# Patient Record
Sex: Male | Born: 1985 | Race: Black or African American | Hispanic: No | Marital: Single | Smoking: Former smoker
Health system: Southern US, Community
[De-identification: ages and names within clinical notes are randomized; demographics above are authoritative.]

## PROBLEM LIST (undated history)

## (undated) DIAGNOSIS — F819 Developmental disorder of scholastic skills, unspecified: Secondary | ICD-10-CM

## (undated) DIAGNOSIS — I10 Essential (primary) hypertension: Secondary | ICD-10-CM

## (undated) DIAGNOSIS — G43909 Migraine, unspecified, not intractable, without status migrainosus: Secondary | ICD-10-CM

## (undated) HISTORY — PX: TONSILLECTOMY AND ADENOIDECTOMY: SUR1326

## (undated) HISTORY — DX: Developmental disorder of scholastic skills, unspecified: F81.9

---

## 2010-07-23 ENCOUNTER — Emergency Department (HOSPITAL_COMMUNITY)
Admission: EM | Admit: 2010-07-23 | Discharge: 2010-07-23 | Payer: Self-pay | Source: Home / Self Care | Admitting: Emergency Medicine

## 2012-06-18 ENCOUNTER — Ambulatory Visit (INDEPENDENT_AMBULATORY_CARE_PROVIDER_SITE_OTHER): Payer: Medicare Other | Admitting: Family Medicine

## 2012-06-18 VITALS — BP 120/76 | HR 80 | Temp 98.4°F | Resp 16 | Ht 66.0 in | Wt 231.0 lb

## 2012-06-18 DIAGNOSIS — Z283 Underimmunization status: Secondary | ICD-10-CM

## 2012-06-18 DIAGNOSIS — Z0000 Encounter for general adult medical examination without abnormal findings: Secondary | ICD-10-CM

## 2012-06-18 DIAGNOSIS — Z2839 Other underimmunization status: Secondary | ICD-10-CM

## 2012-06-18 NOTE — Progress Notes (Signed)
[facility]:  Office Visit  Chief Complaint:  Chief Complaint  Patient presents with  . Annual Exam    also has paperwork for employment pe    HPI: Nathaniel Barton is a 26 y.o. male who complains of: 1. Need a complete physical but patient has Medicare and labs will not be covered and patient does not want to pay out of pocket for this. He denies any health issues. He denies any STDs. Patient has learning disability.  2. Needs immunization records filled out for job. He is an Public house manager. His records are all over the place. He has had different immunizations from different jobs and facilities.  Past Medical History  Diagnosis Date  . Learning disability    Past Surgical History  Procedure Date  . Tonsillectomy and adenoidectomy    History   Social History  . Marital Status: Single    Spouse Name: N/A    Number of Children: N/A  . Years of Education: N/A   Social History Main Topics  . Smoking status: Never Smoker   . Smokeless tobacco: None  . Alcohol Use: Yes  . Drug Use: No  . Sexually Active: Yes     Bisexual   Other Topics Concern  . None   Social History Narrative  . None   Family History  Problem Relation Age of Onset  . Hypertension Mother    Allergies  Allergen Reactions  . Penicillins    Prior to Admission medications   Medication Sig Start Date End Date Taking? Authorizing Provider  ibuprofen (ADVIL,MOTRIN) 800 MG tablet Take 800 mg by mouth every 8 (eight) hours as needed.   Yes Historical Provider, MD     ROS: The patient denies fevers, chills, night sweats, unintentional weight loss, chest pain, palpitations, wheezing, dyspnea on exertion, nausea, vomiting, abdominal pain, dysuria, hematuria, melena, numbness, weakness, or tingling.  All other systems have been reviewed and were otherwise negative with the exception of those mentioned in the HPI and as above.    PHYSICAL EXAM: Filed Vitals:   06/18/12 1140  BP: 120/76  Pulse:  80  Temp: 98.4 F (36.9 C)  Resp: 16   Filed Vitals:   06/18/12 1140  Height: 5\' 6"  (1.676 m)  Weight: 231 lb (104.781 kg)   Body mass index is 37.28 kg/(m^2).  General: Alert, no acute distress HEENT:  Normocephalic, atraumatic, oropharynx patent. EOMI. PERRLA, fundoscopic exam nl Cardiovascular:  Regular rate and rhythm, no rubs murmurs or gallops.  No Carotid bruits, radial pulse intact. No pedal edema.  Respiratory: Clear to auscultation bilaterally.  No wheezes, rales, or rhonchi.  No cyanosis, no use of accessory musculature GI: No organomegaly, abdomen is soft and non-tender, positive bowel sounds.  No masses. Skin: No rashes. Neurologic: Facial musculature symmetric. Psychiatric: Patient is appropriate throughout our interaction. Lymphatic: No cervical lymphadenopathy Musculoskeletal: Gait intact. GU-normal, no hernias, no rashes, no DC   LABS: No results found for this or any previous visit.   EKG/XRAY:   Primary read interpreted by Dr. Conley Roll[facility] at [facility].   ASSESSMENT/PLAN: Encounter Diagnoses  Name Primary?  . Annual physical exam Yes  . Immunization deficiency    I will titers for Varicella and also Hep B since he states he must have gotten them I am awaiting records for DTap, patient had them done recently We have made copies of the forms I have attempted to fill out but have not signed until titers return He has the original  and there is a copy of the forms in my bo which I will fill out when his labs return I will sign off once get labs, if he needs anything then we will let him know and he can return for it here or to the [facility].     Chidera Thivierge PHUONG, DO 06/18/2012 1:10 PM

## 2012-06-19 LAB — VARICELLA ZOSTER ANTIBODY, IGG: Varicella IgG: 3.51 {ISR} — ABNORMAL HIGH (ref <0.90)

## 2012-06-19 LAB — HEPATITIS B SURFACE ANTIGEN: Hepatitis B Surface Ag: NEGATIVE

## 2012-06-19 LAB — HEPATITIS B SURFACE ANTIBODY, QUANTITATIVE: Hep B S AB Quant (Post): 31.7 m[IU]/mL

## 2012-07-08 ENCOUNTER — Encounter: Payer: Self-pay | Admitting: Family Medicine

## 2012-09-11 ENCOUNTER — Emergency Department (HOSPITAL_COMMUNITY)
Admission: EM | Admit: 2012-09-11 | Discharge: 2012-09-11 | Disposition: A | Discharge status: Home / Self Care | Payer: Medicare Other | Attending: Emergency Medicine | Admitting: Emergency Medicine

## 2012-09-11 ENCOUNTER — Emergency Department (HOSPITAL_COMMUNITY): Payer: Medicare Other

## 2012-09-11 ENCOUNTER — Encounter (HOSPITAL_COMMUNITY): Payer: Self-pay | Admitting: Emergency Medicine

## 2012-09-11 DIAGNOSIS — IMO0001 Reserved for inherently not codable concepts without codable children: Secondary | ICD-10-CM | POA: Insufficient documentation

## 2012-09-11 DIAGNOSIS — R197 Diarrhea, unspecified: Secondary | ICD-10-CM | POA: Insufficient documentation

## 2012-09-11 DIAGNOSIS — R0602 Shortness of breath: Secondary | ICD-10-CM | POA: Insufficient documentation

## 2012-09-11 DIAGNOSIS — J111 Influenza due to unidentified influenza virus with other respiratory manifestations: Secondary | ICD-10-CM

## 2012-09-11 DIAGNOSIS — F8189 Other developmental disorders of scholastic skills: Secondary | ICD-10-CM | POA: Insufficient documentation

## 2012-09-11 MED ORDER — ALBUTEROL SULFATE HFA 108 (90 BASE) MCG/ACT IN AERS
2.0000 | INHALATION_SPRAY | RESPIRATORY_TRACT | Status: DC | PRN
  Administered 2012-09-11: 2 via RESPIRATORY_TRACT
  Filled 2012-09-11: qty 6.7

## 2012-09-11 MED ORDER — ACETAMINOPHEN-CODEINE 120-12 MG/5ML PO SOLN
10.0000 mL | Freq: Once | ORAL | Status: AC
  Administered 2012-09-11: 10 mL via ORAL
  Filled 2012-09-11: qty 10

## 2012-09-11 MED ORDER — IPRATROPIUM BROMIDE 0.02 % IN SOLN
0.5000 mg | Freq: Once | RESPIRATORY_TRACT | Status: AC
  Administered 2012-09-11: 0.5 mg via RESPIRATORY_TRACT
  Filled 2012-09-11: qty 2.5

## 2012-09-11 MED ORDER — PROMETHAZINE-DM 6.25-15 MG/5ML PO SYRP: 5.0000 mL | ORAL_SOLUTION | Freq: Four times a day (QID) | ORAL | Status: DC | PRN

## 2012-09-11 MED ORDER — PREDNISONE 50 MG PO TABS: 50.0000 mg | ORAL_TABLET | Freq: Every day | ORAL | Status: DC

## 2012-09-11 MED ORDER — ALBUTEROL SULFATE (5 MG/ML) 0.5% IN NEBU
5.0000 mg | INHALATION_SOLUTION | Freq: Once | RESPIRATORY_TRACT | Status: AC
  Administered 2012-09-11: 5 mg via RESPIRATORY_TRACT
  Filled 2012-09-11: qty 1

## 2012-09-11 MED ORDER — GUAIFENESIN ER 1200 MG PO TB12: 1.0000 | ORAL_TABLET | Freq: Two times a day (BID) | ORAL | Status: DC

## 2012-09-11 NOTE — ED Notes (Signed)
Started with bodyaches, cough, chills, sore throat last pm. Has been sitting with his sick mother in the hospital.

## 2012-09-11 NOTE — ED Provider Notes (Signed)
History     CSN: [id number]  Arrival date & time 09/11/12  1008   First MD Initiated Contact with Patient 09/11/12 1022      No chief complaint on file.   (Consider location/radiation/quality/duration/timing/severity/associated sxs/prior treatment) HPI Patient presents to the emergency department complaining of myalgias, cough, and diarrhea since yesterday. His body aches have subsided. He has a productive cough and nasal congestion, but no shortness of breath or chest pain. Abdominal pain and diarrhea yesterday, now only diarrhea but he has seen some blood in his stool.  He also reports a fever, measured at 102 degrees this morning at his house, and accompanying chills. No nausea, vomiting, chest pain, shortness of breath, headache, or sore throat. No history of asthma and patient is a nonsmoker. He has tried Nyquil cold and flu with minimal relief.  Also, patient complains of rectal tenderness, and he thinks he has a hemorrhoid. He has never had a hemorrhoid before. He has practiced receptive anal intercourse in the past, but not recently. The pain began yesterday.   Finally, he has had a lesion on his right scrotum for about a month. Occasionally it's tender, but never has any discharge or ulceration. He has one male sexual partner who is asymptomatic. No dysuria, pyuria, penile discharge, testicular swelling or tenderness.   Past Medical History  Diagnosis Date  . Learning disability     Past Surgical History  Procedure Date  . Tonsillectomy and adenoidectomy     Family History  Problem Relation Age of Onset  . Hypertension Mother     History  Substance Use Topics  . Smoking status: Never Smoker   . Smokeless tobacco: Not on file  . Alcohol Use: Yes      Review of Systems All other systems negative except as documented in the HPI. All pertinent positives and negatives as reviewed in the HPI.  Allergies  Penicillins  Home Medications   Current Outpatient Rx    Name  Route  Sig  Dispense  Refill  . NYQUIL COLD & FLU PO   Oral   Take 2 tablets by mouth once.           Pulse 110  Temp 99.1 F (37.3 C) (Oral)  Resp 24  SpO2 93%  Physical Exam  Constitutional: He is oriented to person, place, and time. He appears well-developed and well-nourished.  HENT:  Head: Normocephalic and atraumatic.  Mouth/Throat: Oropharynx is clear and moist. No oropharyngeal exudate.  Eyes: Conjunctivae normal are normal. Pupils are equal, round, and reactive to light.  Neck: Neck supple.  Cardiovascular: Normal rate and regular rhythm.   Pulmonary/Chest: Effort normal. No respiratory distress. He has wheezes in the right middle field, the right lower field, the left middle field and the left lower field.  Abdominal: Soft. Normal appearance. He exhibits no distension. There is no hepatosplenomegaly. There is no tenderness.  Genitourinary: Rectal exam shows tenderness. Rectal exam shows no external hemorrhoid and no internal hemorrhoid.        Lymphadenopathy:    He has no cervical adenopathy.  Neurological: He is alert and oriented to person, place, and time.  Skin: Skin is warm and dry.    ED Course  Procedures (including critical care time)  Labs Reviewed - No data to display Dg Chest 2 View  09/11/2012  *RADIOLOGY REPORT*  Clinical Data: Cough, congestion, shortness of breath  CHEST - 2 VIEW  Comparison: None.  Findings: Lungs are clear. No pleural effusion or  pneumothorax.  Cardiomediastinal silhouette is within normal limits.  Visualized osseous structures are within normal limits.  IMPRESSION: No evidence of acute cardiopulmonary disease.   Original Report Authenticated By: Charline Bills, M.D.      Patient be treated for viral URI is advised to rest as much possible.  Increase his fluid intake, follow up with his primary care Dr. for recheck.  Return here for any worsening in his condition   MDM          Carlyle Dolly,  PA-C 09/11/12 1311

## 2012-09-11 NOTE — ED Notes (Signed)
Patient transported to X-ray 

## 2012-09-13 NOTE — ED Provider Notes (Signed)
Medical screening examination/treatment/procedure(s) were performed by non-physician practitioner and as supervising physician I was immediately available for consultation/collaboration.    Nichael Ehly R Obbie Lewallen, MD 09/13/12 1708 

## 2012-11-21 ENCOUNTER — Emergency Department (HOSPITAL_COMMUNITY)
Admission: EM | Admit: 2012-11-21 | Discharge: 2012-11-21 | Disposition: A | Discharge status: Home / Self Care | Payer: Medicare Other | Attending: Emergency Medicine | Admitting: Emergency Medicine

## 2012-11-21 ENCOUNTER — Encounter (HOSPITAL_COMMUNITY): Payer: Self-pay | Admitting: *Deleted

## 2012-11-21 DIAGNOSIS — R110 Nausea: Secondary | ICD-10-CM | POA: Insufficient documentation

## 2012-11-21 DIAGNOSIS — H53149 Visual discomfort, unspecified: Secondary | ICD-10-CM | POA: Insufficient documentation

## 2012-11-21 DIAGNOSIS — H93239 Hyperacusis, unspecified ear: Secondary | ICD-10-CM | POA: Insufficient documentation

## 2012-11-21 DIAGNOSIS — G43909 Migraine, unspecified, not intractable, without status migrainosus: Secondary | ICD-10-CM | POA: Insufficient documentation

## 2012-11-21 DIAGNOSIS — Z8659 Personal history of other mental and behavioral disorders: Secondary | ICD-10-CM | POA: Insufficient documentation

## 2012-11-21 DIAGNOSIS — I10 Essential (primary) hypertension: Secondary | ICD-10-CM | POA: Insufficient documentation

## 2012-11-21 HISTORY — DX: Essential (primary) hypertension: I10

## 2012-11-21 HISTORY — DX: Migraine, unspecified, not intractable, without status migrainosus: G43.909

## 2012-11-21 MED ORDER — METOCLOPRAMIDE HCL 5 MG/ML IJ SOLN
10.0000 mg | Freq: Once | INTRAMUSCULAR | Status: AC
  Administered 2012-11-21: 10 mg via INTRAVENOUS
  Filled 2012-11-21: qty 2

## 2012-11-21 MED ORDER — ACETAMINOPHEN 500 MG PO TABS
1000.0000 mg | ORAL_TABLET | Freq: Once | ORAL | Status: AC
  Administered 2012-11-21: 1000 mg via ORAL
  Filled 2012-11-21: qty 2

## 2012-11-21 NOTE — ED Provider Notes (Signed)
History     CSN: [id number]  Arrival date & time 11/21/12  1743   First MD Initiated Contact with Patient 11/21/12 1853      Chief Complaint  Patient presents with  . Migraine    (Consider location/radiation/quality/duration/timing/severity/associated sxs/prior treatment) HPI Complains of right sided temporal headache gradual onset 2 weeks ago. Throbbing in nature. Treated with ibuprofen, without relief. Associated symptoms include nausea, hyperacusis and photophobia. Pain similar to migraines he's had for the past 2 years. Pain is severe and nonradiating. Made worse by noises and bright lights. Not improved by anything Past Medical History  Diagnosis Date  . Learning disability   . Migraines   . Hypertension     Past Surgical History  Procedure Laterality Date  . Tonsillectomy and adenoidectomy      Family History  Problem Relation Age of Onset  . Hypertension Mother     History  Substance Use Topics  . Smoking status: Never Smoker   . Smokeless tobacco: Not on file  . Alcohol Use: Yes     Comment: occasionally      Review of Systems  Constitutional: Negative.   HENT:       Photophobia  Eyes: Positive for photophobia.  Respiratory: Negative.   Cardiovascular: Negative.   Gastrointestinal: Positive for nausea.  Musculoskeletal: Negative.   Skin: Negative.   Neurological: Positive for headaches.  Psychiatric/Behavioral: Negative.   All other systems reviewed and are negative.    Allergies  Latex and Penicillins  Home Medications   Current Outpatient Rx  Name  Route  Sig  Dispense  Refill  . ibuprofen (ADVIL,MOTRIN) 200 MG tablet   Oral   Take 200 mg by mouth every 6 (six) hours as needed for pain. For headache pain           BP 139/92  Pulse 87  Temp(Src) 98.4 F (36.9 C) (Oral)  Resp 22  SpO2 95%  Physical Exam  Nursing note and vitals reviewed. Constitutional: He is oriented to person, place, and time. He appears well-developed and  well-nourished. He appears distressed.  Appears uncomfortable Glasgow Coma Score 15  HENT:  Head: Normocephalic and atraumatic.  Eyes: Conjunctivae and EOM are normal. Pupils are equal, round, and reactive to light.  Fundi not well visualized. No sob conjunctival erythema  Neck: Neck supple. No tracheal deviation present. No thyromegaly present.  Cardiovascular: Normal rate and regular rhythm.   No murmur heard. Pulmonary/Chest: Effort normal and breath sounds normal.  Abdominal: Soft. Bowel sounds are normal. He exhibits no distension. There is no tenderness.  Obese  Musculoskeletal: Normal range of motion. He exhibits no edema and no tenderness.  Neurological: He is alert and oriented to person, place, and time. He has normal reflexes. No cranial nerve deficit. Coordination normal.  Gait normal Romberg normal prior drift  Skin: Skin is warm and dry. No rash noted.  Psychiatric: He has a normal mood and affect.    ED Course  Procedures (including critical care time)  Labs Reviewed - No data to display No results found. 7:50 PM headache has resolved after treatment with Tylenol and Reglan IV. Patient asymptomatic alert Glasgow Coma Score 15  No diagnosis found.    MDM  Plan followup PMD as needed Diagnosis migraine headache        Doug Sou, MD 11/21/12 2001

## 2012-11-21 NOTE — ED Notes (Signed)
Pt has hx of migraines, but has not had one in 2 years.  For the past 2 weeks pt having increasingly painful headaches and today it has become unbearable.  Pain is to R temple. Pt is photophobic and nauseated.  Appears in great pain.

## 2012-11-21 NOTE — ED Notes (Signed)
Pharmacy tech at bedside 

## 2012-11-21 NOTE — ED Notes (Signed)
PT given ice pack 

## 2012-11-21 NOTE — ED Notes (Signed)
Pt reports he has had a HA X 2 weeks, took ibuprofen earlier today and got some relief but still have a HA, pt c/o photophobia. Pt in nad, skin warm and dry, resp e/u.

## 2013-01-03 ENCOUNTER — Emergency Department (HOSPITAL_COMMUNITY)
Admission: EM | Admit: 2013-01-03 | Discharge: 2013-01-03 | Disposition: A | Discharge status: Home / Self Care | Payer: Medicare Other | Attending: Emergency Medicine | Admitting: Emergency Medicine

## 2013-01-03 ENCOUNTER — Encounter (HOSPITAL_COMMUNITY): Payer: Self-pay | Admitting: Neurology

## 2013-01-03 ENCOUNTER — Emergency Department (HOSPITAL_COMMUNITY): Payer: Medicare Other

## 2013-01-03 DIAGNOSIS — S199XXA Unspecified injury of neck, initial encounter: Secondary | ICD-10-CM | POA: Diagnosis not present

## 2013-01-03 DIAGNOSIS — Y92410 Unspecified street and highway as the place of occurrence of the external cause: Secondary | ICD-10-CM | POA: Diagnosis not present

## 2013-01-03 DIAGNOSIS — Y9389 Activity, other specified: Secondary | ICD-10-CM | POA: Diagnosis not present

## 2013-01-03 DIAGNOSIS — Z8679 Personal history of other diseases of the circulatory system: Secondary | ICD-10-CM | POA: Insufficient documentation

## 2013-01-03 DIAGNOSIS — I10 Essential (primary) hypertension: Secondary | ICD-10-CM | POA: Diagnosis not present

## 2013-01-03 DIAGNOSIS — S0990XA Unspecified injury of head, initial encounter: Secondary | ICD-10-CM | POA: Diagnosis not present

## 2013-01-03 DIAGNOSIS — IMO0002 Reserved for concepts with insufficient information to code with codable children: Secondary | ICD-10-CM | POA: Diagnosis present

## 2013-01-03 DIAGNOSIS — S0993XA Unspecified injury of face, initial encounter: Secondary | ICD-10-CM | POA: Insufficient documentation

## 2013-01-03 DIAGNOSIS — Z8659 Personal history of other mental and behavioral disorders: Secondary | ICD-10-CM | POA: Diagnosis not present

## 2013-01-03 MED ORDER — OXYCODONE-ACETAMINOPHEN 5-325 MG PO TABS
1.0000 | ORAL_TABLET | Freq: Once | ORAL | Status: AC
  Administered 2013-01-03: 1 via ORAL
  Filled 2013-01-03: qty 1

## 2013-01-03 MED ORDER — IBUPROFEN 600 MG PO TABS: 600.0000 mg | ORAL_TABLET | Freq: Four times a day (QID) | ORAL | Status: DC | PRN

## 2013-01-03 MED ORDER — TRAMADOL HCL 50 MG PO TABS: 50.0000 mg | ORAL_TABLET | Freq: Four times a day (QID) | ORAL | Status: DC | PRN

## 2013-01-03 MED ORDER — METHOCARBAMOL 500 MG PO TABS: 1000.0000 mg | ORAL_TABLET | Freq: Four times a day (QID) | ORAL | Status: DC
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
## 2013-01-03 NOTE — ED Notes (Signed)
Pt returned to xray due to needing another image for cervical spine.

## 2013-01-03 NOTE — ED Notes (Signed)
Patient able to ambulate in hall independently. Patient does have a limp favoring the right leg. When asked, patient rates right leg pain an 8/10.

## 2013-01-04 NOTE — ED Provider Notes (Signed)
Medical screening examination/treatment/procedure(s) were performed by non-physician practitioner and as supervising physician I was immediately available for consultation/collaboration.  Juliet Rude. Rubin Payor, MD 01/04/13 [phone]

## 2013-01-10 ENCOUNTER — Other Ambulatory Visit: Payer: Self-pay | Admitting: Sports Medicine

## 2013-01-10 DIAGNOSIS — M545 Low back pain: Secondary | ICD-10-CM

## 2013-01-11 ENCOUNTER — Ambulatory Visit
Admission: RE | Admit: 2013-01-11 | Discharge: 2013-01-11 | Disposition: A | Discharge status: Home / Self Care | Payer: Medicare Other | Source: Ambulatory Visit | Attending: Sports Medicine | Admitting: Sports Medicine

## 2013-01-11 DIAGNOSIS — M545 Low back pain, unspecified: Secondary | ICD-10-CM

## 2013-05-14 ENCOUNTER — Telehealth: Payer: Self-pay

## 2013-05-14 NOTE — Telephone Encounter (Signed)
Contacted patient regarding new patient referral from [facility].   Appointment given. Referral noted counseling needed. Appointment also scheduled with [facility] Counselor but none available until October, 2014. Patient requested a reminder letter be mailed along with address.  Letter sent.   Laurell Josephs, RN

## 2013-05-28 ENCOUNTER — Ambulatory Visit (INDEPENDENT_AMBULATORY_CARE_PROVIDER_SITE_OTHER): Payer: Medicare Other

## 2013-05-28 ENCOUNTER — Other Ambulatory Visit (HOSPITAL_COMMUNITY)
Admission: RE | Admit: 2013-05-28 | Discharge: 2013-05-28 | Disposition: A | Discharge status: Home / Self Care | Payer: Medicare Other | Source: Ambulatory Visit | Attending: Internal Medicine | Admitting: Internal Medicine

## 2013-05-28 DIAGNOSIS — F819 Developmental disorder of scholastic skills, unspecified: Secondary | ICD-10-CM

## 2013-05-28 DIAGNOSIS — Z23 Encounter for immunization: Secondary | ICD-10-CM

## 2013-05-28 DIAGNOSIS — Z791 Long term (current) use of non-steroidal anti-inflammatories (NSAID): Secondary | ICD-10-CM

## 2013-05-28 DIAGNOSIS — B20 Human immunodeficiency virus [HIV] disease: Secondary | ICD-10-CM

## 2013-05-28 DIAGNOSIS — Z113 Encounter for screening for infections with a predominantly sexual mode of transmission: Secondary | ICD-10-CM | POA: Insufficient documentation

## 2013-05-28 LAB — COMPLETE METABOLIC PANEL WITH GFR
ALT: 21 U/L (ref 0–53)
CO2: 27 mEq/L (ref 19–32)
Calcium: 9.7 mg/dL (ref 8.4–10.5)
Chloride: 104 mEq/L (ref 96–112)
Creat: 1.04 mg/dL (ref 0.50–1.35)
GFR, Est African American: >89 mL/min
GFR, Est Non African American: >89 mL/min
Glucose, Bld: 86 mg/dL (ref 70–99)
Total Bilirubin: 0.3 mg/dL (ref 0.3–1.2)

## 2013-05-28 LAB — CBC WITH DIFFERENTIAL/PLATELET
Eosinophils Absolute: 0.1 10*3/uL (ref 0.0–0.7)
Eosinophils Relative: 2 % (ref 0–5)
HCT: 46.6 % (ref 39.0–52.0)
Hemoglobin: 15.6 g/dL (ref 13.0–17.0)
Lymphocytes Relative: 38 % (ref 12–46)
Lymphs Abs: 1.8 10*3/uL (ref 0.7–4.0)
MCH: 27.2 pg (ref 26.0–34.0)
MCV: 81.3 fL (ref 78.0–100.0)
Monocytes Absolute: 0.6 10*3/uL (ref 0.1–1.0)
Monocytes Relative: 12 % (ref 3–12)
RBC: 5.73 MIL/uL (ref 4.22–5.81)
WBC: 4.7 10*3/uL (ref 4.0–10.5)

## 2013-05-28 LAB — LIPID PANEL
HDL: 37 mg/dL — ABNORMAL LOW (ref >39)
Total CHOL/HDL Ratio: 4.5 Ratio
Triglycerides: 148 mg/dL (ref <150)

## 2013-05-28 LAB — HEPATITIS B SURFACE ANTIGEN: Hepatitis B Surface Ag: NEGATIVE

## 2013-05-28 LAB — HEPATITIS B SURFACE ANTIBODY,QUALITATIVE: Hep B S Ab: POSITIVE — AB
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
## 2013-05-29 LAB — URINALYSIS
Glucose, UA: NEGATIVE mg/dL
Ketones, ur: NEGATIVE mg/dL
Leukocytes, UA: NEGATIVE
Nitrite: NEGATIVE
Specific Gravity, Urine: 1.02 (ref 1.005–1.030)
pH: 6 (ref 5.0–8.0)

## 2013-05-29 LAB — T-HELPER CELL (CD4) - (RCID CLINIC ONLY): CD4 % Helper T Cell: 19 % — ABNORMAL LOW (ref 33–55)

## 2013-05-30 LAB — HEPATITIS A ANTIBODY, TOTAL: Hep A Total Ab: NEGATIVE

## 2013-05-31 LAB — HIV-1 GENOTYPR PLUS

## 2013-06-04 ENCOUNTER — Encounter: Payer: Self-pay | Admitting: Internal Medicine

## 2013-06-04 ENCOUNTER — Ambulatory Visit (INDEPENDENT_AMBULATORY_CARE_PROVIDER_SITE_OTHER): Payer: Medicare Other | Admitting: Internal Medicine

## 2013-06-04 VITALS — BP 142/95 | HR 94 | Temp 98.4°F | Wt 225.8 lb

## 2013-06-04 DIAGNOSIS — B20 Human immunodeficiency virus [HIV] disease: Secondary | ICD-10-CM

## 2013-06-04 DIAGNOSIS — A749 Chlamydial infection, unspecified: Secondary | ICD-10-CM

## 2013-06-04 MED ORDER — ELVITEG-COBIC-EMTRICIT-TENOFDF 150-150-200-300 MG PO TABS: 1.0000 | ORAL_TABLET | Freq: Every day | ORAL | Status: DC

## 2013-06-04 MED ORDER — AZITHROMYCIN 250 MG PO TABS
1000.0000 mg | ORAL_TABLET | Freq: Every day | ORAL | Status: DC
  Administered 2013-06-04: 1000 mg via ORAL

## 2013-06-04 NOTE — Progress Notes (Signed)
  [facility] - Pharmacist    HPI: Nathaniel Barton is a 27 y.o. male here to establish care at [facility].  Allergies: Allergies  Allergen Reactions  . Latex     itching  . Penicillins Itching    Vitals: Temp: 98.4 F (36.9 C) (10/07 1439) Temp src: Oral (10/07 1439) BP: 142/95 mmHg (10/07 1439) Pulse Rate: 94 (10/07 1439)  Past Medical History: Past Medical History  Diagnosis Date  . Learning disability   . Migraines   . Hypertension     Social History: History   Social History  . Marital Status: Single    Spouse Name: N/A    Number of Children: N/A  . Years of Education: N/A   Social History Main Topics  . Smoking status: Current Some Day Smoker -- 0.40 packs/day  . Smokeless tobacco: Never Used  . Alcohol Use: 1.8 oz/week    3 Glasses of wine per week     Comment: occasionally  . Drug Use: No  . Sexual Activity: Yes    Partners: Male    Birth Control/ Protection: Condom     Comment: Bisexual   Other Topics Concern  . None   Social History Narrative  . None    Labs: HIV 1 RNA Quant (copies/mL)  Date Value  05/28/2013 45406*     CD4 T Cell Abs (/uL)  Date Value  05/28/2013 390*     Hep B S Ab (no units)  Date Value  05/28/2013 POS*     Hepatitis B Surface Ag (no units)  Date Value  05/28/2013 NEGATIVE      HCV Ab (no units)  Date Value  05/28/2013 NEGATIVE     HIV Genotype Composite Data Genotype Dates: 05/28/13 Mutations in [place] drug susceptibility RT Mutations E138A  PI Mutations none   Interpretation of Genotype Data per [employer] HIV Database Nucleoside RTIs  lamivudine (3TC) Susceptible abacavir (ABC) Susceptible zidovudine (AZT) Susceptible stavudine (D4T) Susceptible didanosine (DDI) Susceptible emtricitabine (FTC) Susceptible tenofovir (TDF) Susceptible   Non-Nucleoside RTIs  efavirenz (EFV) Susceptible etravirine (ETR) Potential low-level resistance nevirapine  (NVP) Susceptible rilpivirine (RPV) Low-level resistance   CrCl: The CrCl is unknown because both a height and weight (above a minimum accepted value) are required for this calculation.  Lipids:    Component Value Date/Time   CHOL 167 05/28/2013 1028   TRIG 148 05/28/2013 1028   HDL 37* 05/28/2013 1028   CHOLHDL 4.5 05/28/2013 1028   VLDL 30 05/28/2013 1028   LDLCALC 100* 05/28/2013 1028    Assessment: Nathaniel Barton is eager to start therapy for his HIV.  He is anxious and does not want to wait for the financial counseling process.  I encouraged him that his CD4 count is adequate at this time and there is no emergency to start therapy. He is committed to therapy but has never taken a daily medication before.  Recommendations: Counseled patient on Stribild including administration instructions and potential adverse effects. Adherence counseling performed with an emphasis on importance of adherence. Tips provided for adherence - he turned down an offer of a pill box, but was interested in setting cell phone reminders.  Sallee Provencal, Pharm.D., BCPS, AAHIVP Clinical Infectious Disease Pharmacist [facility] 06/04/2013, 3:44 PM

## 2013-06-04 NOTE — Progress Notes (Signed)
[facility] HIV CLINIC NOTE  RFV: initial visit for HIV Subjective:    Patient ID: Nathaniel Barton, male    DOB: [birth year], 27 y.o.   MRN: [id number]  HPIantonio is a 27 yo m with hiv dx in aug 2014, cd 4 count 390/ vl 45,406. geo E318A, R to RVP& ETR. ART naive. Hep B immune. He was tested in the setting of obtaining life insurance when he found out he was HIV positive. He previously tested for hiv in [place] 17yrs ago when it was negative.   RF: MSM, last relationship 1 year ago. No IVDU.  Not currently working, previously worked as Lawyer  Current Outpatient Prescriptions on File Prior to Visit  Medication Sig Dispense Refill  . ibuprofen (ADVIL,MOTRIN) 200 MG tablet Take 200 mg by mouth every 6 (six) hours as needed for pain. For headache pain      . ibuprofen (ADVIL,MOTRIN) 600 MG tablet Take 1 tablet (600 mg total) by mouth every 6 (six) hours as needed for pain.  20 tablet  0  . methocarbamol (ROBAXIN) 500 MG tablet Take 2 tablets (1,000 mg total) by mouth 4 (four) times daily.  20 tablet  0  . SUMAtriptan (IMITREX) 25 MG tablet Take 25 mg by mouth every 2 (two) hours as needed for migraine. May repeat in 2 hours if headache persists or recurs.      . traMADol (ULTRAM) 50 MG tablet Take 1 tablet (50 mg total) by mouth every 6 (six) hours as needed for pain.  15 tablet  0   No current facility-administered medications on file prior to visit.   Active Ambulatory Problems    Diagnosis Date Noted  . No Active Ambulatory Problems   Resolved Ambulatory Problems    Diagnosis Date Noted  . No Resolved Ambulatory Problems   Past Medical History  Diagnosis Date  . Learning disability   . Migraines   . Hypertension   - s/p tonsillectomy History   Social History  . Marital Status: Single    Spouse Name: N/A    Number of Children: N/A  . Years of Education: N/A   Occupational History  . Not on file.   Social History Main Topics  . Smoking status: Current Some Day Smoker -- 0.40  packs/day  . Smokeless tobacco: Never Used  . Alcohol Use: 1.8 oz/week    3 Glasses of wine per week     Comment: occasionally  . Drug Use: No  . Sexual Activity: Yes    Partners: Male    Birth Control/ Protection: Condom     Comment: Bisexual   Other Topics Concern  . Not on file   Social History Narrative  . No narrative on file   family history includes Hypertension in his mother.   Review of Systems Review of Systems  Constitutional: Negative for fever, chills, diaphoresis, activity change, appetite change, fatigue and unexpected weight change.  HENT: Negative for congestion, sore throat, rhinorrhea, sneezing, trouble swallowing and sinus pressure.  Eyes: Negative for photophobia and visual disturbance.  Respiratory: Negative for cough, chest tightness, shortness of breath, wheezing and stridor.  Cardiovascular: Negative for chest pain, palpitations and leg swelling.  Gastrointestinal: Negative for nausea, vomiting, abdominal pain, diarrhea, constipation, blood in stool, abdominal distention and anal bleeding.  Genitourinary: Negative for dysuria, hematuria, flank pain and difficulty urinating.  Musculoskeletal: right deltoid pain due to influenza vaccination last week. Skin: Negative for color change, pallor, rash and wound.  Neurological: Negative for dizziness, tremors,  weakness and light-headedness.  Hematological: Negative for adenopathy. Does not bruise/bleed easily.  Psychiatric/Behavioral: Negative for behavioral problems, confusion, sleep disturbance, dysphoric mood, decreased concentration and agitation.       Objective:   Physical Exam BP 142/95  Pulse 94  Temp(Src) 98.4 F (36.9 C) (Oral)  Wt 225 lb 12.8 oz (102.422 kg)  BMI 36.46 kg/m2 Physical Exam  Constitutional: He is oriented to person, place, and time. He appears well-developed and well-nourished. No distress.  HENT:  Mouth/Throat: Oropharynx is clear and moist. No oropharyngeal exudate.   Cardiovascular: Normal rate, regular rhythm and normal heart sounds. Exam reveals no gallop and no friction rub.  No murmur heard.  Pulmonary/Chest: Effort normal and breath sounds normal. No respiratory distress. He has no wheezes.  Abdominal: Soft. Bowel sounds are normal. He exhibits no distension. There is no tenderness.  Lymphadenopathy:  He has no cervical adenopathy.  Neurological: He is alert and oriented to person, place, and time.  Skin: Skin is warm and dry. No rash noted. No erythema.  Psychiatric: He has a normal mood and affect. His behavior is normal.  Ext = right arm tenderness due to flu shot      Assessment & Plan:  hiv = will start stribild. Pharmacy to consult on adherence. Will apply for pateint assistance  Health maintenance = received flu and pneumonia already. Will get hep a at next visit  Arm pain = no signs of erythema or swelling. Possibly had echymosis associated with injection. Recommend to use prn ibuprofen or tylenol for pain.  rtc in 4-6 wks

## 2013-06-12 ENCOUNTER — Other Ambulatory Visit: Payer: Self-pay | Admitting: Licensed Clinical Social Worker

## 2013-06-12 DIAGNOSIS — B20 Human immunodeficiency virus [HIV] disease: Secondary | ICD-10-CM

## 2013-06-12 MED ORDER — ELVITEG-COBIC-EMTRICIT-TENOFDF 150-150-200-300 MG PO TABS: 1.0000 | ORAL_TABLET | Freq: Every day | ORAL | Status: DC

## 2013-06-19 ENCOUNTER — Ambulatory Visit: Payer: Medicare Other | Admitting: Internal Medicine

## 2013-06-25 ENCOUNTER — Ambulatory Visit: Payer: Medicare Other

## 2013-06-25 ENCOUNTER — Other Ambulatory Visit: Payer: Self-pay | Admitting: *Deleted

## 2013-06-25 DIAGNOSIS — B20 Human immunodeficiency virus [HIV] disease: Secondary | ICD-10-CM

## 2013-06-25 MED ORDER — ELVITEG-COBIC-EMTRICIT-TENOFDF 150-150-200-300 MG PO TABS: 1.0000 | ORAL_TABLET | Freq: Every day | ORAL | Status: DC

## 2013-07-04 ENCOUNTER — Ambulatory Visit: Payer: Medicare Other | Admitting: Internal Medicine

## 2013-07-04 ENCOUNTER — Telehealth: Payer: Self-pay | Admitting: *Deleted

## 2013-07-04 ENCOUNTER — Encounter: Payer: Self-pay | Admitting: *Deleted

## 2013-07-04 NOTE — Telephone Encounter (Signed)
Patient needs proof of influenza immunization faxed to his employer [phone]).  RN faxed a letter without clinic heading at patient's request. Pt also was unable to make today's appointment - he was in orientation for work.  Pt has been rescheduled for Tuesday 11/11 with Dr. Drue Second. Andree Coss, RN

## 2013-07-09 ENCOUNTER — Ambulatory Visit: Payer: Medicare Other | Admitting: Internal Medicine

## 2013-08-14 ENCOUNTER — Ambulatory Visit (INDEPENDENT_AMBULATORY_CARE_PROVIDER_SITE_OTHER): Payer: Medicare Other | Admitting: Internal Medicine

## 2013-08-14 ENCOUNTER — Encounter: Payer: Self-pay | Admitting: Internal Medicine

## 2013-08-14 VITALS — BP 134/86 | HR 65 | Temp 98.5°F | Ht 66.0 in | Wt 224.0 lb

## 2013-08-14 DIAGNOSIS — B20 Human immunodeficiency virus [HIV] disease: Secondary | ICD-10-CM

## 2013-08-14 LAB — COMPLETE METABOLIC PANEL WITH GFR
ALT: 42 U/L (ref 0–53)
AST: 26 U/L (ref 0–37)
Albumin: 4.5 g/dL (ref 3.5–5.2)
Alkaline Phosphatase: 92 U/L (ref 39–117)
BUN: 7 mg/dL (ref 6–23)
Calcium: 9.5 mg/dL (ref 8.4–10.5)
Chloride: 103 mEq/L (ref 96–112)
Creat: 1.08 mg/dL (ref 0.50–1.35)
Potassium: 4.3 mEq/L (ref 3.5–5.3)
Total Bilirubin: 0.5 mg/dL (ref 0.3–1.2)

## 2013-08-14 LAB — CBC WITH DIFFERENTIAL/PLATELET
Basophils Absolute: 0 10*3/uL (ref 0.0–0.1)
Basophils Relative: 1 % (ref 0–1)
Eosinophils Absolute: 0 10*3/uL (ref 0.0–0.7)
Eosinophils Relative: 1 % (ref 0–5)
HCT: 46.4 % (ref 39.0–52.0)
MCH: 27.1 pg (ref 26.0–34.0)
MCHC: 34.1 g/dL (ref 30.0–36.0)
MCV: 79.5 fL (ref 78.0–100.0)
Monocytes Absolute: 0.3 10*3/uL (ref 0.1–1.0)
Neutro Abs: 2.2 10*3/uL (ref 1.7–7.7)
Platelets: 214 10*3/uL (ref 150–400)
RDW: 15.9 % — ABNORMAL HIGH (ref 11.5–15.5)

## 2013-08-14 NOTE — Progress Notes (Signed)
   Subjective:    Patient ID: Nathaniel Barton, male    DOB: [birth year], 27 y.o.   MRN: [id number]  HPI He comes in here for work in visit. He was seen as an initial visit in September by Dr. Drue Second for new diagnosis of HIV and was going to start Stribild on the drug assistance program. His initial CD4 count was 360. He subsequently one week later started Stribild with a month's supply provided by a friend. He then got insurance and began getting the medication and has been uninterrupted since 1 October. He comes in due to some side effects including some nausea with vomiting on the initial week and occasional nausea now. He also has noted some increased appetite with subsequent weight gain. Additionally he has had some fatigue since starting the medication.   Review of Systems  Constitutional: Positive for appetite change and fatigue. Negative for fever, activity change and unexpected weight change.  Gastrointestinal: Positive for nausea. Negative for vomiting, abdominal pain and diarrhea.  Skin: Negative for rash.  Psychiatric/Behavioral: The patient is not nervous/anxious.        Objective:   Physical Exam  Constitutional: He is oriented to person, place, and time. He appears well-developed and well-nourished. No distress.  HENT:  Mouth/Throat: No oropharyngeal exudate.  Eyes: No scleral icterus.  Cardiovascular: Normal rate, regular rhythm and normal heart sounds.   No murmur heard. Pulmonary/Chest: Effort normal and breath sounds normal. No respiratory distress. He has no wheezes.  Lymphadenopathy:    He has no cervical adenopathy.  Neurological: He is oriented to person, place, and time.  Skin: No rash noted.  Psychiatric: He has a normal mood and affect. His behavior is normal.          Assessment & Plan:

## 2013-08-14 NOTE — Assessment & Plan Note (Signed)
He seems to be doing well with his medication with some mild side effects. Is not interested in any antinausea medication and with relatively mild symptoms will continue. We'll check his labs today. He'll come back to see his primary provider in 2 weeks.

## 2013-08-16 LAB — T-HELPER CELL (CD4) - (RCID CLINIC ONLY)
CD4 % Helper T Cell: 22 % — ABNORMAL LOW (ref 33–55)
CD4 T Cell Abs: 460 /uL (ref 400–2700)

## 2013-10-08 ENCOUNTER — Ambulatory Visit: Payer: Medicare Other

## 2013-10-08 ENCOUNTER — Ambulatory Visit: Payer: Medicare Other | Admitting: Internal Medicine

## 2013-10-21 ENCOUNTER — Ambulatory Visit: Payer: Medicare Other

## 2013-10-21 ENCOUNTER — Encounter: Payer: Self-pay | Admitting: *Deleted

## 2013-10-21 ENCOUNTER — Ambulatory Visit (INDEPENDENT_AMBULATORY_CARE_PROVIDER_SITE_OTHER): Payer: Medicare Other | Admitting: Internal Medicine

## 2013-10-21 VITALS — Wt 229.0 lb

## 2013-10-21 DIAGNOSIS — R635 Abnormal weight gain: Secondary | ICD-10-CM

## 2013-10-21 DIAGNOSIS — B20 Human immunodeficiency virus [HIV] disease: Secondary | ICD-10-CM

## 2013-10-21 DIAGNOSIS — Z23 Encounter for immunization: Secondary | ICD-10-CM

## 2013-10-21 DIAGNOSIS — G43909 Migraine, unspecified, not intractable, without status migrainosus: Secondary | ICD-10-CM

## 2013-10-21 LAB — COMPLETE METABOLIC PANEL WITH GFR
ALT: 29 U/L (ref 0–53)
AST: 20 U/L (ref 0–37)
Albumin: 4.4 g/dL (ref 3.5–5.2)
Alkaline Phosphatase: 92 U/L (ref 39–117)
BILIRUBIN TOTAL: 0.4 mg/dL (ref 0.2–1.2)
BUN: 8 mg/dL (ref 6–23)
CALCIUM: 9.2 mg/dL (ref 8.4–10.5)
CHLORIDE: 105 meq/L (ref 96–112)
CO2: 26 meq/L (ref 19–32)
CREATININE: 1.03 mg/dL (ref 0.50–1.35)
Glucose, Bld: 80 mg/dL (ref 70–99)
Potassium: 4 mEq/L (ref 3.5–5.3)
Sodium: 139 mEq/L (ref 135–145)
Total Protein: 7.5 g/dL (ref 6.0–8.3)

## 2013-10-21 LAB — CBC WITH DIFFERENTIAL/PLATELET
Basophils Absolute: 0 10*3/uL (ref 0.0–0.1)
Basophils Relative: 0 % (ref 0–1)
EOS ABS: 0.1 10*3/uL (ref 0.0–0.7)
EOS PCT: 2 % (ref 0–5)
HEMATOCRIT: 46.7 % (ref 39.0–52.0)
Hemoglobin: 15.5 g/dL (ref 13.0–17.0)
LYMPHS ABS: 1.7 10*3/uL (ref 0.7–4.0)
LYMPHS PCT: 38 % (ref 12–46)
MCH: 27.8 pg (ref 26.0–34.0)
MCHC: 33.2 g/dL (ref 30.0–36.0)
MCV: 83.7 fL (ref 78.0–100.0)
MONO ABS: 0.5 10*3/uL (ref 0.1–1.0)
Monocytes Relative: 11 % (ref 3–12)
Neutro Abs: 2.2 10*3/uL (ref 1.7–7.7)
Neutrophils Relative %: 49 % (ref 43–77)
Platelets: 192 10*3/uL (ref 150–400)
RBC: 5.58 MIL/uL (ref 4.22–5.81)
RDW: 15.3 % (ref 11.5–15.5)
WBC: 4.5 10*3/uL (ref 4.0–10.5)

## 2013-10-21 MED ORDER — IBUPROFEN 800 MG PO TABS: 800.0000 mg | ORAL_TABLET | Freq: Three times a day (TID) | ORAL | Status: DC | PRN

## 2013-10-21 NOTE — Progress Notes (Signed)
   Subjective:    Patient ID: Nathaniel Barton, male    DOB: October 30, 1985, 28 y.o.   MRN: [id number]  HPI 28yo M with HIV, Cd 4 count of 460/VL<20 on stribild < 3- 4 months. Tolerating ok with exception to weight gain. 4 Lb increase since starting medication. Initially had significant nausea with med but now resolved. 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 He thinks he may have contracted illness from work since he is feeling nausea thinks he is getting a GI virus. No fevers. Chills.  Soc hx: [place] to [place] in [place] who is also HIV  Current Outpatient Prescriptions on File Prior to Visit  Medication Sig Dispense Refill  . elvitegravir-cobicistat-emtricitabine-tenofovir (STRIBILD) 150-150-200-300 MG TABS tablet Take 1 tablet by mouth daily with breakfast.  30 tablet  6  . ibuprofen (ADVIL,MOTRIN) 200 MG tablet Take 200 mg by mouth every 6 (six) hours as needed for pain. For headache pain      . SUMAtriptan (IMITREX) 25 MG tablet Take 25 mg by mouth every 2 (two) hours as needed for migraine. May repeat in 2 hours if headache persists or recurs.       No current facility-administered medications on file prior to visit.   Active Ambulatory Problems    Diagnosis Date Noted  . Human immunodeficiency virus (HIV) disease 06/04/2013   Resolved Ambulatory Problems    Diagnosis Date Noted  . No Resolved Ambulatory Problems   Past Medical History  Diagnosis Date  . Learning disability   . Migraines   . Hypertension       Review of Systems Nausea but no vomiting, and intermittent unintentional weight gain and weight loss. Net is 4+ up from last visit. All other 12 point ros is negative    Objective:   Physical Exam Wt 229 lb (103.874 kg) Physical Exam  Constitutional: He is oriented to person, place, and time. He appears well-developed and well-nourished. No distress.  HENT:  Mouth/Throat: Oropharynx is clear and moist. No oropharyngeal exudate.  Cardiovascular: Normal  rate, regular rhythm and normal heart sounds. Exam reveals no gallop and no friction rub.  No murmur heard.  Pulmonary/Chest: Effort normal and breath sounds normal. No respiratory distress. He has no wheezes.  Abdominal: Soft. Bowel sounds are normal. He exhibits no distension. There is no tenderness.  Lymphadenopathy:  He has no cervical adenopathy.  Neurological: He is alert and oriented to person, place, and time.  Skin: Skin is warm and dry. No rash noted. No erythema.  Psychiatric: He has a normal mood and affect. His behavior is normal.      Assessment & Plan:  hiv = well controlled. Will discussed that he is doing well on current regimen and can consider switching if he feels that weight gain is intolerable. Will check labs today  Weight gain = recommended diet modification  Migraines = gave rx for ibuprofen 800mg  Q 8hr PRN  Health maintenace = will give hep A vaccine series since he is is MSM  rtc in 3 months

## 2013-10-22 LAB — T-HELPER CELL (CD4) - (RCID CLINIC ONLY)
CD4 T CELL HELPER: 23 % — AB (ref 33–55)
CD4 T Cell Abs: 360 /uL — ABNORMAL LOW (ref 400–2700)

## 2013-10-22 LAB — HIV-1 RNA QUANT-NO REFLEX-BLD: HIV-1 RNA Quant, Log: <1.3 {Log} (ref <1.30)

## 2013-10-27 LAB — HLA B*5701: HLA-B 5701 W/RFLX HLA-B HIGH: NEGATIVE
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
## 2013-11-20 ENCOUNTER — Other Ambulatory Visit: Payer: Self-pay | Admitting: *Deleted

## 2013-11-20 DIAGNOSIS — B20 Human immunodeficiency virus [HIV] disease: Secondary | ICD-10-CM

## 2013-11-20 MED ORDER — ELVITEG-COBIC-EMTRICIT-TENOFDF 150-150-200-300 MG PO TABS: 1.0000 | ORAL_TABLET | Freq: Every day | ORAL | Status: DC

## 2013-11-20 NOTE — Progress Notes (Signed)
ADAP application 

## 2013-12-02 ENCOUNTER — Other Ambulatory Visit: Payer: Self-pay | Admitting: Internal Medicine

## 2013-12-03 ENCOUNTER — Telehealth: Payer: Self-pay | Admitting: *Deleted

## 2013-12-03 NOTE — Telephone Encounter (Signed)
Called [employer] and got an emergency supply of Stribild for [place].

## 2013-12-27 ENCOUNTER — Other Ambulatory Visit: Payer: Self-pay | Admitting: Internal Medicine

## 2014-01-01 ENCOUNTER — Other Ambulatory Visit: Payer: Self-pay | Admitting: Internal Medicine

## 2014-01-01 ENCOUNTER — Other Ambulatory Visit: Payer: Self-pay | Admitting: *Deleted

## 2014-01-01 ENCOUNTER — Telehealth: Payer: Self-pay | Admitting: *Deleted

## 2014-01-01 ENCOUNTER — Ambulatory Visit: Payer: Medicare Other | Admitting: Internal Medicine

## 2014-01-01 DIAGNOSIS — G43909 Migraine, unspecified, not intractable, without status migrainosus: Secondary | ICD-10-CM

## 2014-01-01 MED ORDER — IBUPROFEN 800 MG PO TABS: ORAL_TABLET | ORAL | Status: DC

## 2014-01-01 NOTE — Telephone Encounter (Signed)
Contacted the patient re: tooth pain.  Left message with [facility]'s phone number to ask Nathaniel Barton about the upcoming dental clinic, asked him to call for an appointment with a physician to assess his dental pain.  RN stated that I cannot authorize a prescription for this without him being seen. [name], RN  Pt's message: ______________________ I have a tooth ache I'm asking can I get something for pain something stronger than [employer] is not working

## 2014-01-01 NOTE — Telephone Encounter (Signed)
[facility]

## 2014-01-22 ENCOUNTER — Ambulatory Visit: Payer: Medicare Other | Admitting: Internal Medicine

## 2014-01-24 ENCOUNTER — Other Ambulatory Visit: Payer: Self-pay | Admitting: Internal Medicine

## 2014-01-27 ENCOUNTER — Other Ambulatory Visit: Payer: Self-pay | Admitting: *Deleted

## 2014-01-27 ENCOUNTER — Ambulatory Visit: Payer: Medicare Other

## 2014-01-27 DIAGNOSIS — B20 Human immunodeficiency virus [HIV] disease: Secondary | ICD-10-CM

## 2014-01-27 MED ORDER — ELVITEG-COBIC-EMTRICIT-TENOFDF 150-150-200-300 MG PO TABS: ORAL_TABLET | ORAL | Status: DC

## 2014-01-27 NOTE — Telephone Encounter (Signed)
Pt needs to make and keep lab work and MD appts.

## 2014-02-17 ENCOUNTER — Telehealth: Payer: Self-pay

## 2014-02-17 NOTE — Telephone Encounter (Signed)
Patient is calling to see if we received lab results from primary physician.  We have not received any communication as yet.   He states he was told his "counts were 250" and he needed to see Dr Drue SecondSnider.  I think he may be referring to CD4 count but advised he call primary office with our fax number and we will speak with him after labs are received.  Patient has concerns and would like to see Dr Drue SecondSnider for office visit. He was transferred to scheduler.   Laurell Josephsammy K King, RN

## 2014-02-26 ENCOUNTER — Ambulatory Visit (INDEPENDENT_AMBULATORY_CARE_PROVIDER_SITE_OTHER): Payer: Medicare Other | Admitting: Internal Medicine

## 2014-02-26 VITALS — Wt 214.0 lb

## 2014-02-26 DIAGNOSIS — G43909 Migraine, unspecified, not intractable, without status migrainosus: Secondary | ICD-10-CM

## 2014-02-26 DIAGNOSIS — R5383 Other fatigue: Principal | ICD-10-CM

## 2014-02-26 DIAGNOSIS — R5381 Other malaise: Secondary | ICD-10-CM

## 2014-02-26 DIAGNOSIS — R635 Abnormal weight gain: Secondary | ICD-10-CM

## 2014-02-26 LAB — TSH: TSH: 1.085 u[IU]/mL (ref 0.350–4.500)

## 2014-02-26 LAB — T4, FREE: Free T4: 1.24 ng/dL (ref 0.80–1.80)

## 2014-02-26 MED ORDER — PHENTERMINE HCL 30 MG PO TBDP: 1.0000 | ORAL_TABLET | Freq: Every day | ORAL | Status: AC

## 2014-02-26 NOTE — Progress Notes (Signed)
   Subjective:    Patient ID: Nathaniel Barton, male    DOB: 27-May-1986, 27 y.o.   MRN: [id number]  HPI 27yo M with HIV, Cd 4 count of 360/VL<20 (feb 2015) on stribild. Tolerating ok with exception to weight gain. Good adherence. No fevers. Chills.has been losing weight with phentermine in the last month. Working out and trying to improve dietary intake. Has lost 25 lb since last visit Feels fatigue. Denies constipation, denies depression. Sleeps 8-10hr per night.  He had hiv labs done at his PCP: cd 4 count of 262/VL<20. Cr 1.12. hgt 16  Meds: phenermine Current Outpatient Prescriptions on File Prior to Visit  Medication Sig Dispense Refill  . elvitegravir-cobicistat-emtricitabine-tenofovir (STRIBILD) 150-150-200-300 MG TABS tablet TAKE 1 TABLET BY MOUTH EVERY DAY WITH BREAKFAST  30 tablet  3  . ibuprofen (ADVIL,MOTRIN) 200 MG tablet Take 200 mg by mouth every 6 (six) hours as needed for pain. For headache pain      . ibuprofen (ADVIL,MOTRIN) 800 MG tablet TAKE 1 TABLET BY MOUTH EVERY 8 HOURS AS NEEDED  30 tablet  0  . SUMAtriptan (IMITREX) 25 MG tablet Take 25 mg by mouth every 2 (two) hours as needed for migraine. May repeat in 2 hours if headache persists or recurs.       No current facility-administered medications on file prior to visit.   Active Ambulatory Problems    Diagnosis Date Noted  . Human immunodeficiency virus (HIV) disease 06/04/2013   Resolved Ambulatory Problems    Diagnosis Date Noted  . No Resolved Ambulatory Problems   Past Medical History  Diagnosis Date  . Learning disability   . Migraines   . Hypertension         Review of Systems 10 point ros    Objective:   Physical Exam Wt 214 lb (97.07 kg) Physical Exam  Constitutional: He is oriented to person, place, and time. He appears well-developed and well-nourished. No distress.  HENT:  Mouth/Throat: Oropharynx is clear and moist. No oropharyngeal exudate.  Cardiovascular: Normal rate, regular rhythm  and normal heart sounds. Exam reveals no gallop and no friction rub.  No murmur heard.  Pulmonary/Chest: Effort normal and breath sounds normal. No respiratory distress. He has no wheezes.  Abdominal: Soft. Bowel sounds are normal. He exhibits no distension. There is no tenderness.  Lymphadenopathy:  He has no cervical adenopathy.  Neurological: He is alert and oriented to person, place, and time.  Skin: Skin is warm and dry. No rash noted. No erythema.  Psychiatric: He has a normal mood and affect. His behavior is normal.       Assessment & Plan:  hiv = well controlled, continue on stribild. Will check if there is drug interaction with phentermine. Cd 4 count lower than previous in February when cd 4 coutn of 360. He remains undetectable. Will repeat cd 4 count at next visit  Health maintenance= next hep a at end of aug  Weight loss = as monitored by his pcp while on phentermine x 2 months. Will get him nutritionist appt  Fatigue = will check thyroid function, vit b12 level  [facility]  rtc in 3 months

## 2014-03-03 ENCOUNTER — Ambulatory Visit: Payer: Medicare Other

## 2014-03-04 ENCOUNTER — Ambulatory Visit: Payer: Medicare Other | Admitting: *Deleted

## 2014-03-04 DIAGNOSIS — F10129 Alcohol abuse with intoxication, unspecified: Secondary | ICD-10-CM

## 2014-03-04 NOTE — Progress Notes (Signed)
Patient ID: Nathaniel Barton, male   DOB: 1986/02/28, 28 y.o.   MRN: [id number]  COUNSELOR MET Ariyan TODAY FOR THE FIRST TIME.  PATIENT WAS SOFT SPOKEN AND APPROPRIATE.  PATIENT GAVE GOOD EYE CONTACT AND WAS RESPECTFUL.  PATIENT REPORTED EXPERIENCING DEPRESSION OVER A PERIOD OF TIME BUT FELT IT HAD ESCALATED SINCE FINDING OUT HE WAS HIV POSITIVE.  PATIENT REPORTED THAT RECENTLY HE HAS STARTED DRINKING LARGER AMOUNTS TO DULL THE PAIN AND DEPRESSION. PATIENT FEELS HE IS DRINKING TOO MUCH.  PATIENT SHARED THAT HE IS NOT HAVING ANY SUICIDAL IDEATION AT THIS TIME BUT HAS HAD THOSE THOUGHTS IN THE PAST.  COUNSELOR RECOMMENDED THAT PATIENT MEET WITH SA COUNSELOR REGULARLY TO SORT OUT HIS FEELINGS OF DEPRESSION AS WELL AS WORK ON HIS INCREASING SUBSTANCE USE.  PATIENT AGREED THAT HE NEEDED SOBRIETY SUPPORT AND CONTINUED SA COUNSELING. COUNSELOR PROVIDED PATIENT MUCH NEEDED SUPPORT AND ENCOURAGEMENT.  PATIENT ASKED TO SEE COUNSELOR NEXT WEEK AND REPORTED THAT HE DID NOT WANT TO SEE A MALE COUNSELOR.  JODI HERRING, LPCA, MA [facility]

## 2014-03-10 ENCOUNTER — Other Ambulatory Visit: Payer: Self-pay | Admitting: Internal Medicine

## 2014-03-10 DIAGNOSIS — G43909 Migraine, unspecified, not intractable, without status migrainosus: Secondary | ICD-10-CM

## 2014-03-13 ENCOUNTER — Ambulatory Visit: Payer: Medicare Other | Admitting: *Deleted

## 2014-04-02 ENCOUNTER — Ambulatory Visit: Payer: Medicare Other

## 2014-04-09 ENCOUNTER — Other Ambulatory Visit: Payer: Self-pay | Admitting: *Deleted

## 2014-04-09 DIAGNOSIS — B20 Human immunodeficiency virus [HIV] disease: Secondary | ICD-10-CM

## 2014-04-09 MED ORDER — ELVITEG-COBIC-EMTRICIT-TENOFDF 150-150-200-300 MG PO TABS: ORAL_TABLET | ORAL | Status: DC

## 2014-04-24 ENCOUNTER — Ambulatory Visit: Payer: Medicare Other

## 2014-06-03 ENCOUNTER — Ambulatory Visit: Payer: Medicare Other | Admitting: Internal Medicine

## 2014-06-09 ENCOUNTER — Ambulatory Visit: Payer: Medicare Other | Admitting: Internal Medicine

## 2014-06-24 ENCOUNTER — Other Ambulatory Visit: Payer: Self-pay | Admitting: *Deleted

## 2014-06-24 DIAGNOSIS — B20 Human immunodeficiency virus [HIV] disease: Secondary | ICD-10-CM

## 2014-06-24 DIAGNOSIS — Z113 Encounter for screening for infections with a predominantly sexual mode of transmission: Secondary | ICD-10-CM

## 2014-06-24 DIAGNOSIS — Z79899 Other long term (current) drug therapy: Secondary | ICD-10-CM

## 2014-06-27 ENCOUNTER — Other Ambulatory Visit: Payer: Self-pay | Admitting: Internal Medicine

## 2014-06-27 ENCOUNTER — Other Ambulatory Visit (HOSPITAL_COMMUNITY)
Admission: RE | Admit: 2014-06-27 | Discharge: 2014-06-27 | Disposition: A | Discharge status: Home / Self Care | Payer: Medicare Other | Source: Ambulatory Visit | Attending: Internal Medicine | Admitting: Internal Medicine

## 2014-06-27 ENCOUNTER — Other Ambulatory Visit: Payer: Medicare Other

## 2014-06-27 ENCOUNTER — Ambulatory Visit: Payer: Medicare Other

## 2014-06-27 DIAGNOSIS — B20 Human immunodeficiency virus [HIV] disease: Secondary | ICD-10-CM

## 2014-06-27 DIAGNOSIS — Z113 Encounter for screening for infections with a predominantly sexual mode of transmission: Secondary | ICD-10-CM | POA: Diagnosis present

## 2014-06-27 DIAGNOSIS — Z79899 Other long term (current) drug therapy: Secondary | ICD-10-CM

## 2014-06-27 LAB — T-HELPER CELL (CD4) - (RCID CLINIC ONLY)
CD4 T CELL HELPER: 24 % — AB (ref 33–55)
CD4 T Cell Abs: 330 /uL — ABNORMAL LOW (ref 400–2700)

## 2014-06-28 LAB — RPR

## 2014-06-28 LAB — CBC WITH DIFFERENTIAL/PLATELET
BASOS ABS: 0 10*3/uL (ref 0.0–0.1)
Basophils Relative: 0 % (ref 0–1)
EOS PCT: 1 % (ref 0–5)
Eosinophils Absolute: 0 10*3/uL (ref 0.0–0.7)
HCT: 47.1 % (ref 39.0–52.0)
Hemoglobin: 15.5 g/dL (ref 13.0–17.0)
LYMPHS ABS: 1.3 10*3/uL (ref 0.7–4.0)
Lymphocytes Relative: 30 % (ref 12–46)
MCH: 28 pg (ref 26.0–34.0)
MCHC: 32.9 g/dL (ref 30.0–36.0)
MCV: 85 fL (ref 78.0–100.0)
Monocytes Absolute: 0.4 10*3/uL (ref 0.1–1.0)
Monocytes Relative: 9 % (ref 3–12)
Neutro Abs: 2.6 10*3/uL (ref 1.7–7.7)
Neutrophils Relative %: 60 % (ref 43–77)
PLATELETS: 186 10*3/uL (ref 150–400)
RBC: 5.54 MIL/uL (ref 4.22–5.81)
RDW: 15.4 % (ref 11.5–15.5)
WBC: 4.4 10*3/uL (ref 4.0–10.5)

## 2014-06-28 LAB — COMPLETE METABOLIC PANEL WITH GFR
ALT: 31 U/L (ref 0–53)
AST: 25 U/L (ref 0–37)
Albumin: 4.6 g/dL (ref 3.5–5.2)
Alkaline Phosphatase: 97 U/L (ref 39–117)
BILIRUBIN TOTAL: 0.5 mg/dL (ref 0.2–1.2)
BUN: 11 mg/dL (ref 6–23)
CO2: 24 meq/L (ref 19–32)
Calcium: 9.3 mg/dL (ref 8.4–10.5)
Chloride: 106 mEq/L (ref 96–112)
Creat: 1.19 mg/dL (ref 0.50–1.35)
GFR, Est Non African American: 83 mL/min
Glucose, Bld: 74 mg/dL (ref 70–99)
Potassium: 4.2 mEq/L (ref 3.5–5.3)
Sodium: 140 mEq/L (ref 135–145)
Total Protein: 7.1 g/dL (ref 6.0–8.3)

## 2014-06-28 LAB — LIPID PANEL
Cholesterol: 163 mg/dL (ref 0–200)
HDL: 40 mg/dL (ref >39)
LDL Cholesterol: 96 mg/dL (ref 0–99)
Total CHOL/HDL Ratio: 4.1 Ratio
Triglycerides: 137 mg/dL (ref <150)
VLDL: 27 mg/dL (ref 0–40)

## 2014-06-30 LAB — HIV-1 RNA QUANT-NO REFLEX-BLD
HIV 1 RNA Quant: <20 copies/mL (ref <20)
HIV-1 RNA Quant, Log: <1.3 {Log} (ref <1.30)

## 2014-06-30 LAB — URINE CYTOLOGY ANCILLARY ONLY
Chlamydia: NEGATIVE
Neisseria Gonorrhea: NEGATIVE

## 2014-07-08 ENCOUNTER — Ambulatory Visit (INDEPENDENT_AMBULATORY_CARE_PROVIDER_SITE_OTHER): Payer: Medicare Other | Admitting: Internal Medicine

## 2014-07-08 ENCOUNTER — Ambulatory Visit: Payer: Medicare Other

## 2014-07-08 ENCOUNTER — Encounter: Payer: Self-pay | Admitting: Internal Medicine

## 2014-07-08 VITALS — BP 123/82 | HR 80 | Temp 97.8°F | Wt 215.0 lb

## 2014-07-08 DIAGNOSIS — Z716 Tobacco abuse counseling: Secondary | ICD-10-CM

## 2014-07-08 DIAGNOSIS — Z21 Asymptomatic human immunodeficiency virus [HIV] infection status: Secondary | ICD-10-CM

## 2014-07-08 DIAGNOSIS — B20 Human immunodeficiency virus [HIV] disease: Secondary | ICD-10-CM

## 2014-07-08 DIAGNOSIS — Z720 Tobacco use: Secondary | ICD-10-CM

## 2014-07-08 MED ORDER — ELVITEG-COBIC-EMTRICIT-TENOFDF 150-150-200-300 MG PO TABS: 1.0000 | ORAL_TABLET | Freq: Every day | ORAL | Status: DC

## 2014-07-08 MED ORDER — NICOTINE 7 MG/24HR TD PT24: 7.0000 mg | MEDICATED_PATCH | Freq: Every day | TRANSDERMAL | Status: AC

## 2014-07-08 NOTE — Progress Notes (Signed)
Patient ID: Nathaniel Barton, male   DOB: [birth year], 28 y.o.   MRN: [id number]       Patient ID: Nathaniel Barton, male   DOB: [birth year], 28 y.o.   MRN: [id number]  HPI 28yo M with HIV disease, CD 4 count of 330/VL<20 on stribild. Doing well with medication. He has been intermittently on phentermine for weight loss. He is off of it presently but still having difficulty maintaining weight. Attempts to exercise regularly but well rounded diet is difficult  Smoking 1 ppd due to stress Outpatient Encounter Prescriptions as of 07/08/2014  Medication Sig  . ibuprofen (ADVIL,MOTRIN) 800 MG tablet TAKE 1 TABLET BY MOUTH EVERY 8 HOURS AS NEEDED  . Phentermine HCl 30 MG TBDP Take 1 tablet by mouth daily.  . STRIBILD 150-150-200-300 MG TABS tablet TAKE 1 TABLET BY MOUTH DAILY WITH BREAKFAST  . SUMAtriptan (IMITREX) 25 MG tablet Take 25 mg by mouth every 2 (two) hours as needed for migraine. May repeat in 2 hours if headache persists or recurs.     Patient Active Problem List   Diagnosis Date Noted  . Human immunodeficiency virus (HIV) disease 06/04/2013     Health Maintenance Due  Topic Date Due  . TETANUS/TDAP  03/24/2006  . INFLUENZA VACCINE  03/29/2014     Review of Systems  Constitutional: Negative for fever, chills, diaphoresis, activity change, appetite change, fatigue and unexpected weight change.  HENT: Negative for congestion, sore throat, rhinorrhea, sneezing, trouble swallowing and sinus pressure.  Eyes: Negative for photophobia and visual disturbance.  Respiratory: Negative for cough, chest tightness, shortness of breath, wheezing and stridor.  Cardiovascular: Negative for chest pain, palpitations and leg swelling.  Gastrointestinal: Negative for nausea, vomiting, abdominal pain, diarrhea, constipation, blood in stool, abdominal distention and anal bleeding.  Genitourinary: Negative for dysuria, hematuria, flank pain and difficulty urinating.  Musculoskeletal: Negative for myalgias,  back pain, joint swelling, arthralgias and gait problem.  Skin: Negative for color change, pallor, rash and wound.  Neurological: Negative for dizziness, tremors, weakness and light-headedness.  Hematological: Negative for adenopathy. Does not bruise/bleed easily.  Psychiatric/Behavioral: Negative for behavioral problems, confusion, sleep disturbance, dysphoric mood, decreased concentration and agitation.    Physical Exam   BP 123/82 mmHg  Pulse 80  Temp(Src) 97.8 F (36.6 C) (Oral)  Wt 215 lb (97.523 kg) Physical Exam  Constitutional: He is oriented to person, place, and time. He appears well-developed and well-nourished. No distress.  HENT:  Mouth/Throat: Oropharynx is clear and moist. No oropharyngeal exudate.  Cardiovascular: Normal rate, regular rhythm and normal heart sounds. Exam reveals no gallop and no friction rub.  No murmur heard.  Pulmonary/Chest: Effort normal and breath sounds normal. No respiratory distress. He has no wheezes.  Abdominal: Soft. Bowel sounds are normal. He exhibits no distension. There is no tenderness.  Lymphadenopathy:  He has no cervical adenopathy.  Neurological: He is alert and oriented to person, place, and time.  Skin: Skin is warm and dry. No rash noted. No erythema.  Psychiatric: He has a normal mood and affect. His behavior is normal.    Lab Results  Component Value Date   CD4TCELL 24* 06/27/2014   Lab Results  Component Value Date   CD4TABS 330* 06/27/2014   CD4TABS 360* 10/21/2013   CD4TABS 460 08/14/2013   Lab Results  Component Value Date   HIV1RNAQUANT <20 06/27/2014   Lab Results  Component Value Date   HEPBSAB POS* 05/28/2013   No results found for: RPR  CBC Lab  Results  Component Value Date   WBC 4.4 06/27/2014   RBC 5.54 06/27/2014   HGB 15.5 06/27/2014   HCT 47.1 06/27/2014   PLT 186 06/27/2014   MCV 85.0 06/27/2014   MCH 28.0 06/27/2014   MCHC 32.9 06/27/2014   RDW 15.4 06/27/2014   LYMPHSABS 1.3  06/27/2014   MONOABS 0.4 06/27/2014   EOSABS 0.0 06/27/2014   BASOSABS 0.0 06/27/2014   BMET Lab Results  Component Value Date   NA 140 06/27/2014   K 4.2 06/27/2014   CL 106 06/27/2014   CO2 24 06/27/2014   GLUCOSE 74 06/27/2014   BUN 11 06/27/2014   CREATININE 1.19 06/27/2014   CALCIUM 9.3 06/27/2014   GFRNONAA 83 06/27/2014   GFRAA >89 06/27/2014     Assessment and Plan   hiv = continue on stribild, well controlled   Smoking cessation = will do a trial of nicotine patch. Spent 5 min discussing smoking cessation  Stress = continue with counseling sessions  Health maintenance = received flu vaccination thru work at [employer]  Weight gain = encourage continued exercise and protein diet

## 2014-07-22 ENCOUNTER — Ambulatory Visit: Payer: Medicare Other

## 2014-07-30 ENCOUNTER — Other Ambulatory Visit: Payer: Self-pay | Admitting: Internal Medicine

## 2014-08-04 ENCOUNTER — Telehealth: Payer: Self-pay | Admitting: *Deleted

## 2014-08-04 ENCOUNTER — Ambulatory Visit (INDEPENDENT_AMBULATORY_CARE_PROVIDER_SITE_OTHER): Payer: Medicare Other | Admitting: Infectious Disease

## 2014-08-04 ENCOUNTER — Encounter: Payer: Self-pay | Admitting: Infectious Disease

## 2014-08-04 VITALS — BP 133/84 | HR 115 | Temp 98.4°F | Wt 218.0 lb

## 2014-08-04 DIAGNOSIS — J069 Acute upper respiratory infection, unspecified: Secondary | ICD-10-CM

## 2014-08-04 DIAGNOSIS — B20 Human immunodeficiency virus [HIV] disease: Secondary | ICD-10-CM

## 2014-08-04 DIAGNOSIS — R197 Diarrhea, unspecified: Secondary | ICD-10-CM | POA: Insufficient documentation

## 2014-08-04 NOTE — Progress Notes (Signed)
Subjective:    Patient ID: Nathaniel Barton, male    DOB: 28-Oct-1985, 28 y.o.   MRN: [id number]  HPI  28 year old man with HIV perfectly controlled on STRIBILD  Lab Results  Component Value Date   HIV1RNAQUANT <20 06/27/2014   Lab Results  Component Value Date   CD4TABS 330* 06/27/2014   CD4TABS 360* 10/21/2013   CD4TABS 460 08/14/2013    He comes in today due to concerns of upper respiratory tract infection that he contracted last few days. His tetanus states that his temperature at home with 105 and his mother advised him to go to the emergency department. Here in the clinic he is afebrile but he does have some significant sinus congestion. He has been trying over-the-counter Mucinex so far without much symptom relief. 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 He asked whether assay for him to return to work.    Review of Systems  Constitutional: Positive for fever, chills, diaphoresis and fatigue. Negative for activity change, appetite change and unexpected weight change.  HENT: Positive for congestion, sinus pressure, sneezing and sore throat. Negative for rhinorrhea and trouble swallowing.   Eyes: Negative for photophobia and visual disturbance.  Respiratory: Positive for cough. Negative for chest tightness, shortness of breath, wheezing and stridor.   Cardiovascular: Negative for chest pain, palpitations and leg swelling.  Gastrointestinal: Positive for diarrhea. Negative for nausea, vomiting, abdominal pain, constipation, blood in stool, abdominal distention and anal bleeding.  Genitourinary: Negative for dysuria, hematuria, flank pain and difficulty urinating.  Musculoskeletal: Negative for myalgias, back pain, joint swelling, arthralgias and gait problem.  Skin: Negative for color change, pallor, rash and wound.  Neurological: Negative for dizziness, tremors, weakness and light-headedness.  Hematological: Negative for adenopathy. Does not bruise/bleed easily.    Psychiatric/Behavioral: Negative for behavioral problems, confusion, sleep disturbance, dysphoric mood, decreased concentration and agitation.       Objective:   Physical Exam  Constitutional: He is oriented to person, place, and time. He appears well-developed and well-nourished.  HENT:  Head: Normocephalic and atraumatic.  Nose: Mucosal edema present.  Eyes: Conjunctivae and EOM are normal.  Neck: Normal range of motion. Neck supple.  Cardiovascular: Normal rate, regular rhythm and normal heart sounds.  Exam reveals no gallop and no friction rub.   No murmur heard. Pulmonary/Chest: Effort normal and breath sounds normal. No respiratory distress. He has no wheezes. He has no rales. He exhibits no tenderness.  Abdominal: Soft. He exhibits no distension. There is no tenderness.  Musculoskeletal: Normal range of motion. He exhibits no edema or tenderness.  Neurological: He is alert and oriented to person, place, and time.  Skin: Skin is warm and dry. No rash noted. No erythema. No pallor.          Assessment & Plan:   URI with fever: Manage symptomatically with over-the-counter Afrin Flonase Tylenol nonsteroidals if necessary rest. Should not return to work until he has filled [facility] policy with regards to febrile upper respiratory tract infections. Minus before meals needs to be afebrile for 2 days without use of antipyretics.  HIV: Very well controlled on STRIBILD he is concerned about the fact that the CD4 count is not risen much beyond 300. I cautioned him to please be patient about this. He actually took an extra STRIBILD during his acute illness thinking that this would help his immune system I told him not to do this and this did not add anything to his care  and only pose risks to him. I spent greater than 25 minutes with the patient including greater than 50% of time in face to face counsel of the patient and in coordination of their care.   Loose stools he states he is  having yellowish stools and is concerned that he may have C. difficile colitis he had 6 bowel movements yesterday but only 2 today so far I think C. difficile colitis is unlikely meds told him to change to a more bland diet in the interim

## 2014-08-04 NOTE — Telephone Encounter (Signed)
Patient called and advised he has been sick for about 1 week. He complains of aches and pains, weakness, fever and cough. He wants to be seen and was given an appt for today 08/04/14 aat 345 pm.

## 2014-08-28 ENCOUNTER — Other Ambulatory Visit: Payer: Self-pay | Admitting: Internal Medicine

## 2014-09-10 ENCOUNTER — Telehealth: Payer: Self-pay | Admitting: *Deleted

## 2014-09-10 NOTE — Telephone Encounter (Signed)
Pt applied for FMLA, company electronically sending forms.  Pt would like a call back from T. Dutch Quintoole when she receives the paperwork.

## 2014-09-10 NOTE — Telephone Encounter (Signed)
Called the patient and advised him we received his FMLA paperwork and will get it back to him in about 5-7 days. Patient advised to fax back and mail him a copy as well. Advised him will do so asap.

## 2014-09-24 ENCOUNTER — Other Ambulatory Visit: Payer: Medicare Other

## 2014-09-30 ENCOUNTER — Ambulatory Visit: Payer: Medicare Other

## 2014-09-30 ENCOUNTER — Other Ambulatory Visit: Payer: Medicare Other

## 2014-09-30 DIAGNOSIS — B20 Human immunodeficiency virus [HIV] disease: Secondary | ICD-10-CM

## 2014-09-30 LAB — CBC WITH DIFFERENTIAL/PLATELET
BASOS PCT: 0 % (ref 0–1)
Basophils Absolute: 0 10*3/uL (ref 0.0–0.1)
Eosinophils Absolute: 0 10*3/uL (ref 0.0–0.7)
Eosinophils Relative: 1 % (ref 0–5)
HEMATOCRIT: 46.1 % (ref 39.0–52.0)
Hemoglobin: 15.3 g/dL (ref 13.0–17.0)
Lymphocytes Relative: 38 % (ref 12–46)
Lymphs Abs: 1.6 10*3/uL (ref 0.7–4.0)
MCH: 28.2 pg (ref 26.0–34.0)
MCHC: 33.2 g/dL (ref 30.0–36.0)
MCV: 85.1 fL (ref 78.0–100.0)
MPV: 10.7 fL (ref 8.6–12.4)
Monocytes Absolute: 0.3 10*3/uL (ref 0.1–1.0)
Monocytes Relative: 8 % (ref 3–12)
Neutro Abs: 2.2 10*3/uL (ref 1.7–7.7)
Neutrophils Relative %: 53 % (ref 43–77)
Platelets: 181 10*3/uL (ref 150–400)
RBC: 5.42 MIL/uL (ref 4.22–5.81)
RDW: 14.5 % (ref 11.5–15.5)
WBC: 4.2 10*3/uL (ref 4.0–10.5)

## 2014-09-30 LAB — COMPREHENSIVE METABOLIC PANEL
ALK PHOS: 80 U/L (ref 39–117)
ALT: 23 U/L (ref 0–53)
AST: 18 U/L (ref 0–37)
Albumin: 4.3 g/dL (ref 3.5–5.2)
BUN: 11 mg/dL (ref 6–23)
CALCIUM: 9.4 mg/dL (ref 8.4–10.5)
CO2: 26 mEq/L (ref 19–32)
Chloride: 103 mEq/L (ref 96–112)
Creat: 1.14 mg/dL (ref 0.50–1.35)
Glucose, Bld: 83 mg/dL (ref 70–99)
Potassium: 4.1 mEq/L (ref 3.5–5.3)
Sodium: 136 mEq/L (ref 135–145)
TOTAL PROTEIN: 7.3 g/dL (ref 6.0–8.3)
Total Bilirubin: 0.4 mg/dL (ref 0.2–1.2)

## 2014-10-01 LAB — T-HELPER CELL (CD4) - (RCID CLINIC ONLY)
CD4 % Helper T Cell: 25 % — ABNORMAL LOW (ref 33–55)
CD4 T Cell Abs: 450 /uL (ref 400–2700)

## 2014-10-02 LAB — HIV-1 RNA QUANT-NO REFLEX-BLD: HIV 1 RNA Quant: <20 copies/mL (ref <20)

## 2014-10-06 ENCOUNTER — Other Ambulatory Visit: Payer: Self-pay | Admitting: Internal Medicine

## 2014-10-07 ENCOUNTER — Other Ambulatory Visit: Payer: Self-pay | Admitting: *Deleted

## 2014-10-07 DIAGNOSIS — Z21 Asymptomatic human immunodeficiency virus [HIV] infection status: Secondary | ICD-10-CM

## 2014-10-07 MED ORDER — ELVITEG-COBIC-EMTRICIT-TENOFDF 150-150-200-300 MG PO TABS: 1.0000 | ORAL_TABLET | Freq: Every day | ORAL | Status: DC

## 2014-10-09 ENCOUNTER — Ambulatory Visit: Payer: Medicare Other | Admitting: Internal Medicine

## 2014-10-09 ENCOUNTER — Ambulatory Visit: Payer: Medicare Other

## 2014-11-18 ENCOUNTER — Ambulatory Visit: Payer: Medicare Other

## 2014-11-18 ENCOUNTER — Ambulatory Visit: Payer: Medicare Other | Admitting: Internal Medicine

## 2015-01-27 ENCOUNTER — Ambulatory Visit
Admission: RE | Admit: 2015-01-27 | Discharge: 2015-01-27 | Disposition: A | Discharge status: Home / Self Care | Payer: Medicare Other | Source: Ambulatory Visit | Attending: Family Medicine | Admitting: Family Medicine

## 2015-01-27 ENCOUNTER — Other Ambulatory Visit (INDEPENDENT_AMBULATORY_CARE_PROVIDER_SITE_OTHER): Payer: Medicare Other

## 2015-01-27 ENCOUNTER — Other Ambulatory Visit: Payer: Self-pay | Admitting: Family Medicine

## 2015-01-27 DIAGNOSIS — R109 Unspecified abdominal pain: Secondary | ICD-10-CM

## 2015-01-27 DIAGNOSIS — B20 Human immunodeficiency virus [HIV] disease: Secondary | ICD-10-CM

## 2015-01-27 LAB — CBC WITH DIFFERENTIAL/PLATELET
Basophils Absolute: 0 10*3/uL (ref 0.0–0.1)
Basophils Relative: 0 % (ref 0–1)
EOS ABS: 0.1 10*3/uL (ref 0.0–0.7)
EOS PCT: 1 % (ref 0–5)
HCT: 48.8 % (ref 39.0–52.0)
Hemoglobin: 16 g/dL (ref 13.0–17.0)
LYMPHS ABS: 1.7 10*3/uL (ref 0.7–4.0)
Lymphocytes Relative: 34 % (ref 12–46)
MCH: 27.9 pg (ref 26.0–34.0)
MCHC: 32.8 g/dL (ref 30.0–36.0)
MCV: 85 fL (ref 78.0–100.0)
MONO ABS: 0.4 10*3/uL (ref 0.1–1.0)
MONOS PCT: 8 % (ref 3–12)
MPV: 10.8 fL (ref 8.6–12.4)
Neutro Abs: 2.9 10*3/uL (ref 1.7–7.7)
Neutrophils Relative %: 57 % (ref 43–77)
Platelets: 191 10*3/uL (ref 150–400)
RBC: 5.74 MIL/uL (ref 4.22–5.81)
RDW: 14.8 % (ref 11.5–15.5)
WBC: 5 10*3/uL (ref 4.0–10.5)

## 2015-01-28 LAB — COMPLETE METABOLIC PANEL WITH GFR
ALBUMIN: 4.4 g/dL (ref 3.5–5.2)
ALK PHOS: 103 U/L (ref 39–117)
ALT: 29 U/L (ref 0–53)
AST: 22 U/L (ref 0–37)
BUN: 9 mg/dL (ref 6–23)
CHLORIDE: 103 meq/L (ref 96–112)
CO2: 24 mEq/L (ref 19–32)
Calcium: 9.5 mg/dL (ref 8.4–10.5)
Creat: 1.3 mg/dL (ref 0.50–1.35)
GFR, EST NON AFRICAN AMERICAN: 75 mL/min
GFR, Est African American: 86 mL/min
Glucose, Bld: 91 mg/dL (ref 70–99)
Potassium: 3.7 mEq/L (ref 3.5–5.3)
Sodium: 138 mEq/L (ref 135–145)
Total Bilirubin: 0.4 mg/dL (ref 0.2–1.2)
Total Protein: 7.3 g/dL (ref 6.0–8.3)

## 2015-01-28 LAB — T-HELPER CELL (CD4) - (RCID CLINIC ONLY)
CD4 T CELL HELPER: 27 % — AB (ref 33–55)
CD4 T Cell Abs: 470 /uL (ref 400–2700)

## 2015-01-28 LAB — RPR

## 2015-01-29 LAB — HIV-1 RNA QUANT-NO REFLEX-BLD
HIV 1 RNA Quant: <20 copies/mL (ref <20)
HIV-1 RNA Quant, Log: <1.3 {Log} (ref <1.30)

## 2015-03-30 ENCOUNTER — Other Ambulatory Visit: Payer: Self-pay | Admitting: Internal Medicine

## 2015-03-31 ENCOUNTER — Other Ambulatory Visit: Payer: Self-pay | Admitting: Internal Medicine

## 2015-05-05 ENCOUNTER — Other Ambulatory Visit: Payer: Self-pay | Admitting: Internal Medicine

## 2015-05-05 ENCOUNTER — Other Ambulatory Visit: Payer: Medicare Other

## 2015-05-05 DIAGNOSIS — G43909 Migraine, unspecified, not intractable, without status migrainosus: Secondary | ICD-10-CM

## 2015-05-06 ENCOUNTER — Other Ambulatory Visit: Payer: Medicare Other

## 2015-05-06 ENCOUNTER — Other Ambulatory Visit: Payer: Self-pay | Admitting: Infectious Disease

## 2015-05-06 DIAGNOSIS — B20 Human immunodeficiency virus [HIV] disease: Secondary | ICD-10-CM

## 2015-05-29 NOTE — Progress Notes (Signed)
Faxed approval to [facility]. Howell, Michelle M, RN  

## 2015-06-03 ENCOUNTER — Telehealth: Payer: Self-pay | Admitting: *Deleted

## 2015-06-03 NOTE — Telephone Encounter (Signed)
Patient called and requested a referral to dental in [place]. He advised he has a hole in his tooth and they will not see him unless we give them a referral. Advised the patient he has not been seen in clinic since 07/2014 and has missed several appts and if they want [place] to speak to his fitness for procedures we can not do so. He will need a visit and recent labs as we have to a have visit and documentation to refer to if we are called to do so. Also advised the patient we have dental access here. He advised he was asked not to come back to our dental clinic as he had several no shows and was not nice to the employees. Advised him can give him our first available and he can discuss with the doctor. Patient has an appt set for 06/30/15.

## 2015-06-03 NOTE — Telephone Encounter (Signed)
Call from a [facility] clinic asking for a Nathaniel Barton referral for this patient. He has multiple no shows and cancellations for [employer]. His last appt here was 07/2014 and he has an upcoming appt with Dr. Drue Second on 06/30/15. Advised that to my knowledge his Nathaniel Barton would have expired on 05/29/15 and he would need to have that renewed. Denied the referral and suggested that he keep his upcoming appt with Dr. Drue Second to have his dental needs addressed at that time. Wendall Mola

## 2015-06-30 ENCOUNTER — Ambulatory Visit: Payer: Medicare Other | Admitting: Internal Medicine

## 2015-07-21 ENCOUNTER — Other Ambulatory Visit: Payer: Self-pay | Admitting: Internal Medicine
